# Patient Record
Sex: Male | Born: 1970 | Race: Black or African American | Hispanic: No | Marital: Single | Smoking: Former smoker
Health system: Southern US, Community
[De-identification: ages and names within clinical notes are randomized; demographics above are authoritative.]

## PROBLEM LIST (undated history)

## (undated) DIAGNOSIS — M549 Dorsalgia, unspecified: Secondary | ICD-10-CM

## (undated) DIAGNOSIS — W3301XA Accidental discharge of shotgun, initial encounter: Secondary | ICD-10-CM

## (undated) HISTORY — PX: SPLENECTOMY, TOTAL: SHX788

## (undated) HISTORY — PX: OTHER SURGICAL HISTORY: SHX169

---

## 2012-03-10 ENCOUNTER — Emergency Department (HOSPITAL_COMMUNITY)
Admission: EM | Admit: 2012-03-10 | Discharge: 2012-03-10 | Disposition: A | Discharge status: Home / Self Care | Payer: Self-pay | Attending: Emergency Medicine | Admitting: Emergency Medicine

## 2012-03-10 ENCOUNTER — Encounter (HOSPITAL_COMMUNITY): Payer: Self-pay | Admitting: *Deleted

## 2012-03-10 DIAGNOSIS — H109 Unspecified conjunctivitis: Secondary | ICD-10-CM | POA: Insufficient documentation

## 2012-03-10 DIAGNOSIS — F17200 Nicotine dependence, unspecified, uncomplicated: Secondary | ICD-10-CM | POA: Insufficient documentation

## 2012-03-10 DIAGNOSIS — H5789 Other specified disorders of eye and adnexa: Secondary | ICD-10-CM | POA: Insufficient documentation

## 2012-03-10 MED ORDER — ERYTHROMYCIN 5 MG/GM OP OINT
TOPICAL_OINTMENT | Freq: Once | OPHTHALMIC | Status: AC
  Administered 2012-03-10: 02:00:00 via OPHTHALMIC
  Filled 2012-03-10: qty 1

## 2012-03-10 NOTE — ED Notes (Signed)
Rt eye red and itching for 3 days redness

## 2012-03-10 NOTE — ED Provider Notes (Signed)
Medical screening examination/treatment/procedure(s) were performed by non-physician practitioner and as supervising physician I was immediately available for consultation/collaboration.  Doug Sou, MD 03/10/12 [phone]

## 2012-03-10 NOTE — ED Provider Notes (Signed)
History     CSN: [id number]  Arrival date & time 03/10/12  0046   None     Chief Complaint  Patient presents with  . Eye Problem    (Consider location/radiation/quality/duration/timing/severity/associated sxs/prior treatment) HPI Comments: Patient states, for the past 3, days.  He said away if she watery eye.  His daughter recently was diagnosed with pinkeye.  He denies any URI symptoms, previous history of pinkeye to his knowledge  Patient is a 41 y.o. male presenting with eye problem. The history is provided by the patient.  Eye Problem  This is a new problem. The current episode started more than 2 days ago. The problem occurs constantly. The problem has not changed since onset.The right eye is affected.Associated symptoms include discharge and eye redness. Pertinent negatives include no photophobia.    History reviewed. No pertinent past medical history.  History reviewed. No pertinent past surgical history.  No family history on file.  History  Substance Use Topics  . Smoking status: Current Every Day Smoker  . Smokeless tobacco: Not on file  . Alcohol Use:       Review of Systems  Constitutional: Negative for fever and chills.  HENT: Negative for congestion.   Eyes: Positive for discharge, redness and itching. Negative for photophobia, pain and visual disturbance.  Cardiovascular: Negative.   Gastrointestinal: Negative.   Genitourinary: Negative.   Musculoskeletal: Negative.   Neurological: Negative for dizziness and headaches.    Allergies  Review of patient's allergies indicates no known allergies.  Home Medications  No current outpatient prescriptions on file.  BP 178/130  Pulse 76  Temp 98.3 F (36.8 C) (Oral)  Resp 16  SpO2 97%  Physical Exam  Vitals reviewed. Constitutional: He appears well-developed.  HENT:  Head: Normocephalic.  Eyes: EOM are normal. Pupils are equal, round, and reactive to light. Right eye exhibits discharge and  exudate. Right conjunctiva is injected. Right conjunctiva has no hemorrhage. No scleral icterus.  Neck: Normal range of motion.  Cardiovascular: Normal rate.   Musculoskeletal: Normal range of motion.  Neurological: He is alert.  Skin: Skin is warm.    ED Course  Procedures (including critical care time)  Labs Reviewed - No data to display No results found.   No diagnosis found.    MDM   Patient has, conjunctivitis.  We'll treat with erythromycin ointment for the next 4-5 days Blood pressure was repeated with the appropriate sized cuff.  He is normotensive      Arman Filter, NP 03/10/12 [phone]

## 2012-12-06 ENCOUNTER — Encounter (HOSPITAL_COMMUNITY): Payer: Self-pay

## 2012-12-06 ENCOUNTER — Emergency Department (HOSPITAL_COMMUNITY)
Admission: EM | Admit: 2012-12-06 | Discharge: 2012-12-06 | Disposition: A | Discharge status: Home / Self Care | Payer: Self-pay | Attending: Emergency Medicine | Admitting: Emergency Medicine

## 2012-12-06 DIAGNOSIS — M545 Low back pain, unspecified: Secondary | ICD-10-CM | POA: Insufficient documentation

## 2012-12-06 DIAGNOSIS — M549 Dorsalgia, unspecified: Secondary | ICD-10-CM

## 2012-12-06 DIAGNOSIS — Z87828 Personal history of other (healed) physical injury and trauma: Secondary | ICD-10-CM | POA: Insufficient documentation

## 2012-12-06 DIAGNOSIS — F17200 Nicotine dependence, unspecified, uncomplicated: Secondary | ICD-10-CM | POA: Insufficient documentation

## 2012-12-06 DIAGNOSIS — M5430 Sciatica, unspecified side: Secondary | ICD-10-CM | POA: Insufficient documentation

## 2012-12-06 DIAGNOSIS — M5432 Sciatica, left side: Secondary | ICD-10-CM

## 2012-12-06 HISTORY — DX: Dorsalgia, unspecified: M54.9

## 2012-12-06 HISTORY — DX: Accidental discharge of shotgun, initial encounter: W33.01XA

## 2012-12-06 MED ORDER — KETOROLAC TROMETHAMINE 60 MG/2ML IM SOLN
60.0000 mg | Freq: Once | INTRAMUSCULAR | Status: AC
  Administered 2012-12-06: 60 mg via INTRAMUSCULAR
  Filled 2012-12-06: qty 2

## 2012-12-06 MED ORDER — PREDNISONE 20 MG PO TABS: 40.0000 mg | ORAL_TABLET | Freq: Every day | ORAL | Status: DC

## 2012-12-06 MED ORDER — HYDROCODONE-ACETAMINOPHEN 5-325 MG PO TABS: 2.0000 | ORAL_TABLET | Freq: Four times a day (QID) | ORAL | Status: DC | PRN

## 2012-12-06 MED ORDER — IBUPROFEN 800 MG PO TABS: 800.0000 mg | ORAL_TABLET | Freq: Three times a day (TID) | ORAL | Status: DC

## 2012-12-06 MED ORDER — HYDROCODONE-ACETAMINOPHEN 5-325 MG PO TABS
2.0000 | ORAL_TABLET | Freq: Once | ORAL | Status: AC
  Administered 2012-12-06: 2 via ORAL
  Filled 2012-12-06: qty 2

## 2012-12-06 NOTE — ED Notes (Signed)
Pt arrives to the ED ambulatory with lower back pain that started on Friday and the pain radiates down the left leg with some tingling in the leg.  Pt does not remember doing anything to injure his back

## 2012-12-06 NOTE — ED Provider Notes (Signed)
Medical screening examination/treatment/procedure(s) were performed by non-physician practitioner and as supervising physician I was immediately available for consultation/collaboration.  Reshanda Lewey M Sheilyn Boehlke, MD 12/06/12 0644 
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
## 2013-02-02 ENCOUNTER — Emergency Department (INDEPENDENT_AMBULATORY_CARE_PROVIDER_SITE_OTHER): Payer: Self-pay

## 2013-02-02 ENCOUNTER — Emergency Department (INDEPENDENT_AMBULATORY_CARE_PROVIDER_SITE_OTHER)
Admission: EM | Admit: 2013-02-02 | Discharge: 2013-02-02 | Disposition: A | Discharge status: Home / Self Care | Payer: Self-pay | Source: Home / Self Care | Attending: Emergency Medicine | Admitting: Emergency Medicine

## 2013-02-02 ENCOUNTER — Encounter (HOSPITAL_COMMUNITY): Payer: Self-pay | Admitting: Emergency Medicine

## 2013-02-02 DIAGNOSIS — M25569 Pain in unspecified knee: Secondary | ICD-10-CM

## 2013-02-02 DIAGNOSIS — M25562 Pain in left knee: Secondary | ICD-10-CM

## 2013-02-02 MED ORDER — KETOROLAC TROMETHAMINE 60 MG/2ML IM SOLN
INTRAMUSCULAR | Status: AC
  Filled 2013-02-02: qty 2

## 2013-02-02 MED ORDER — MELOXICAM 15 MG PO TABS: 15.0000 mg | ORAL_TABLET | Freq: Every day | ORAL | Status: DC

## 2013-02-02 MED ORDER — KETOROLAC TROMETHAMINE 60 MG/2ML IM SOLN
60.0000 mg | Freq: Once | INTRAMUSCULAR | Status: AC
  Administered 2013-02-02: 60 mg via INTRAMUSCULAR

## 2013-02-02 MED ORDER — PREDNISONE 10 MG PO TABS: ORAL_TABLET | ORAL | Status: DC

## 2013-02-02 NOTE — ED Notes (Signed)
C/o pain and swelling in left knee for past few days, no known injury; no relief w OTC medications and knee brace since yesterday

## 2013-02-02 NOTE — ED Provider Notes (Signed)
Chief Complaint:   Chief Complaint  Patient presents with  . Knee Pain    History of Present Illness:   Antonio Malone is a 42 year old male who has had a three-day history of left knee pain and swelling. He denies any injury. The knee is swollen somewhat warm. He localizes the pain over the anterior knee. There is no locking, popping, or giving way of the knee joint. It hurts to walk or to bend the knee.  Review of Systems:  Other than noted above, the patient denies any of the following symptoms: Systemic:  No fevers, chills, sweats, or aches.  No fatigue or tiredness. Musculoskeletal:  No joint pain, arthritis, bursitis, swelling, back pain, or neck pain.  Neurological:  No muscular weakness, paresthesias, headache, or trouble with speech or coordination.  No dizziness.  PMFSH:  Past medical history, family history, social history, meds, and allergies were reviewed.  No prior history of knee pain or arthritis.  He does not have a prior history of gout.  Physical Exam:   Vital signs:  BP 162/98  Pulse 83  Temp(Src) 98.2 F (36.8 C) (Oral)  Resp 16  SpO2 97% Gen:  Alert and oriented times 3.  In no distress. Musculoskeletal: There is mild visible swelling, some joint effusion, and mild warmth, but no erythema. There is tenderness to palpation around the patella, over the patella, at the medial and lateral joint lines. He can flex to 135, but this causes pain.   McMurray's test was negative.  Lachman's test was negative.  Anterior drawer test was negative.   Varus and valgus stress negative.  Otherwise, all joints had a full a ROM with no swelling, bruising or deformity.  No edema, pulses full. Extremities were warm and pink.  Capillary refill was brisk.  Skin:  Clear, warm and dry.  No rash. Neuro:  Alert and oriented times 3.  Muscle strength was normal.  Sensation was intact to light touch.   Radiology:  Dg Knee Complete 4 Views Left  02/02/2013   CLINICAL DATA:  Pain and swelling left  knee for 2 days without injury  EXAM: LEFT KNEE - COMPLETE 4+ VIEW  COMPARISON:  None  FINDINGS: Question mild patellofemoral joint space narrowing.  Osseous mineralization normal.  Remaining joint spaces preserved.  No acute fracture, dislocation or bone destruction.  Small knee joint effusion present.  Remaining soft tissues unremarkable.  IMPRESSION: Mild patellofemoral degenerative changes and knee joint effusion.  No acute osseous findings.   Electronically Signed   By: Ulyses Southward M.D.   On: 02/02/2013 11:56   I reviewed the images independently and personally and concur with the radiologist's findings.   Course in Urgent Care Center:   He was given Toradol 60 mg IM with almost immediate improvement in his pain. He was placed in a knee immobilizer.  Assessment:  The encounter diagnosis was Knee pain, acute, left.  Differential diagnosis is gout versus internal cartilage derangement in the knee such as a torn meniscus or chondromalacia patella.  Plan:   1.  Meds:  The following meds were prescribed:   Discharge Medication List as of 02/02/2013 12:27 PM    START taking these medications   Details  meloxicam (MOBIC) 15 MG tablet Take 1 tablet (15 mg total) by mouth daily., Starting 02/02/2013, Until Discontinued, Normal    !! predniSONE (DELTASONE) 10 MG tablet Take 4 tabs daily for 4 days, 3 tabs daily for 4 days, 2 tabs daily for 4 days,  then 1 tab daily for 4 days., Normal     !! - Potential duplicate medications found. Please discuss with provider.      2.  Patient Education/Counseling:  The patient was given appropriate handouts, self care instructions, and instructed in symptomatic relief, including rest and activity, elevation, application of ice and compression.   3.  Follow up:  The patient was told to follow up if no better in 3 to 4 days, if becoming worse in any way, and given some red flag symptoms such as fever or worsening pain which would prompt immediate return.  Follow  up in 2 weeks if no better with Dr. August Saucer.     Reuben Likes, MD 02/02/13 [phone]

## 2015-02-02 IMAGING — CR DG KNEE COMPLETE 4+V*L*
4 series · 4 of 4 positions shown · non-contrast
Comparison: None

CLINICAL DATA: Pain and swelling left knee for 2 days without
injury

EXAM:
LEFT KNEE - COMPLETE 4+ VIEW

[view not recorded (1 of 4)]
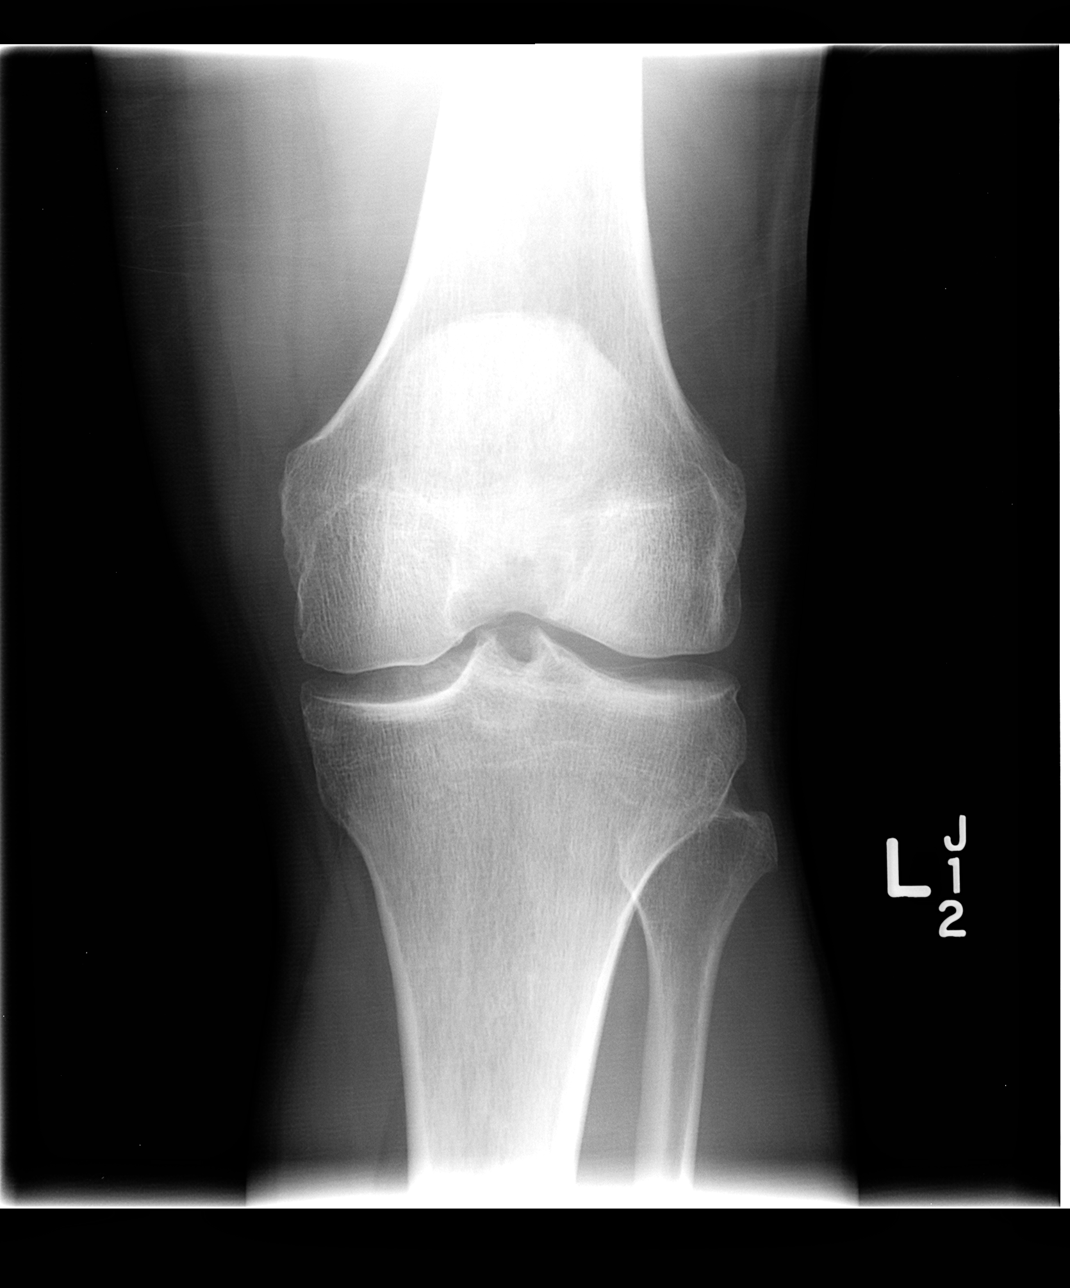

[view not recorded (2 of 4)]
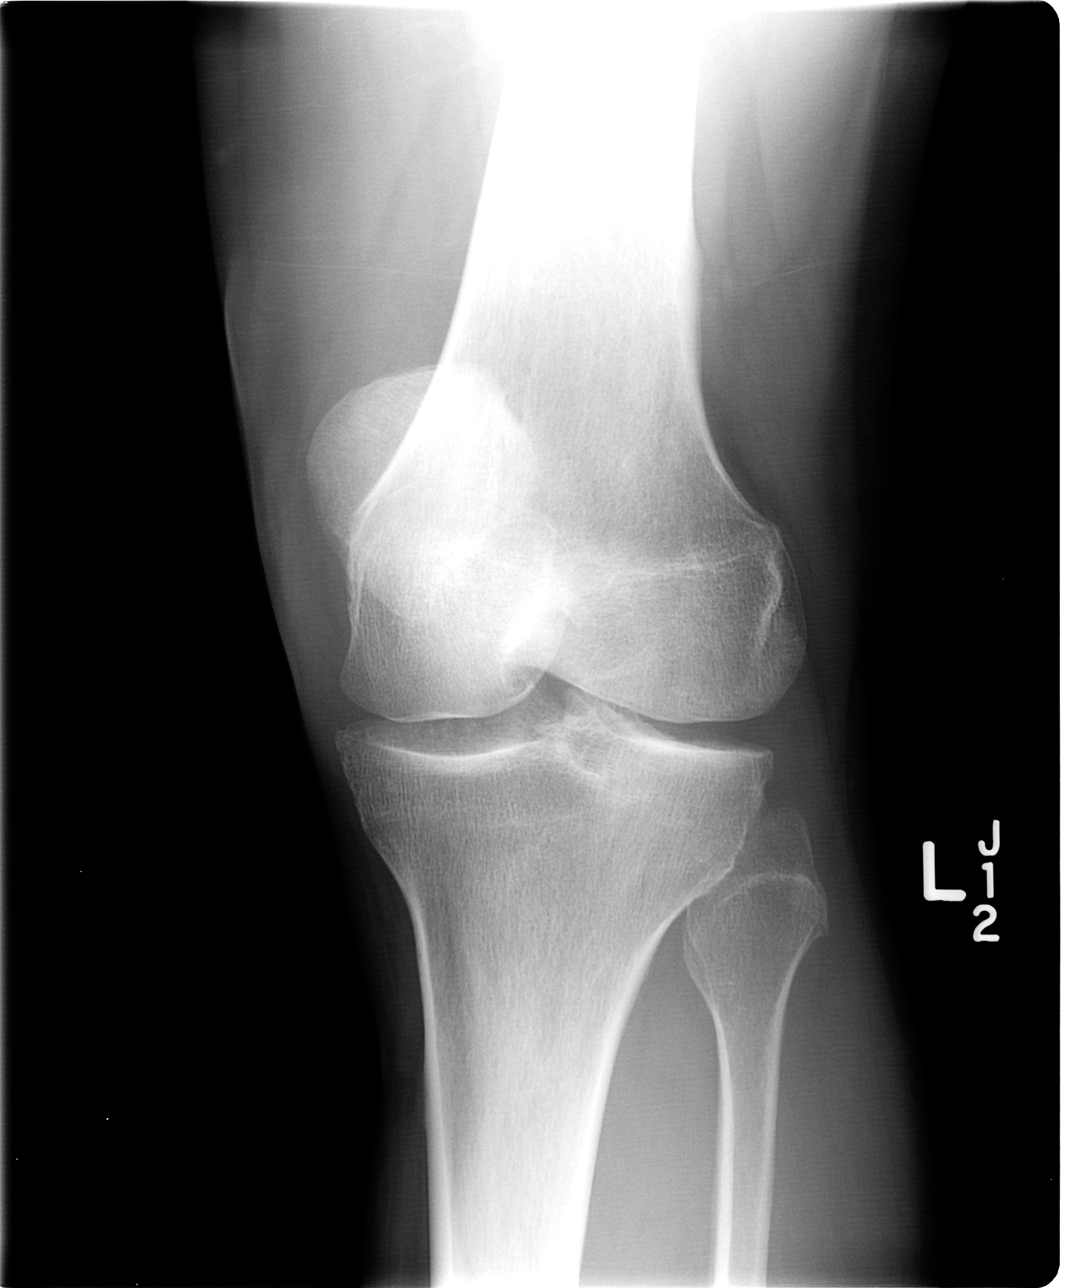

[view not recorded (3 of 4)]
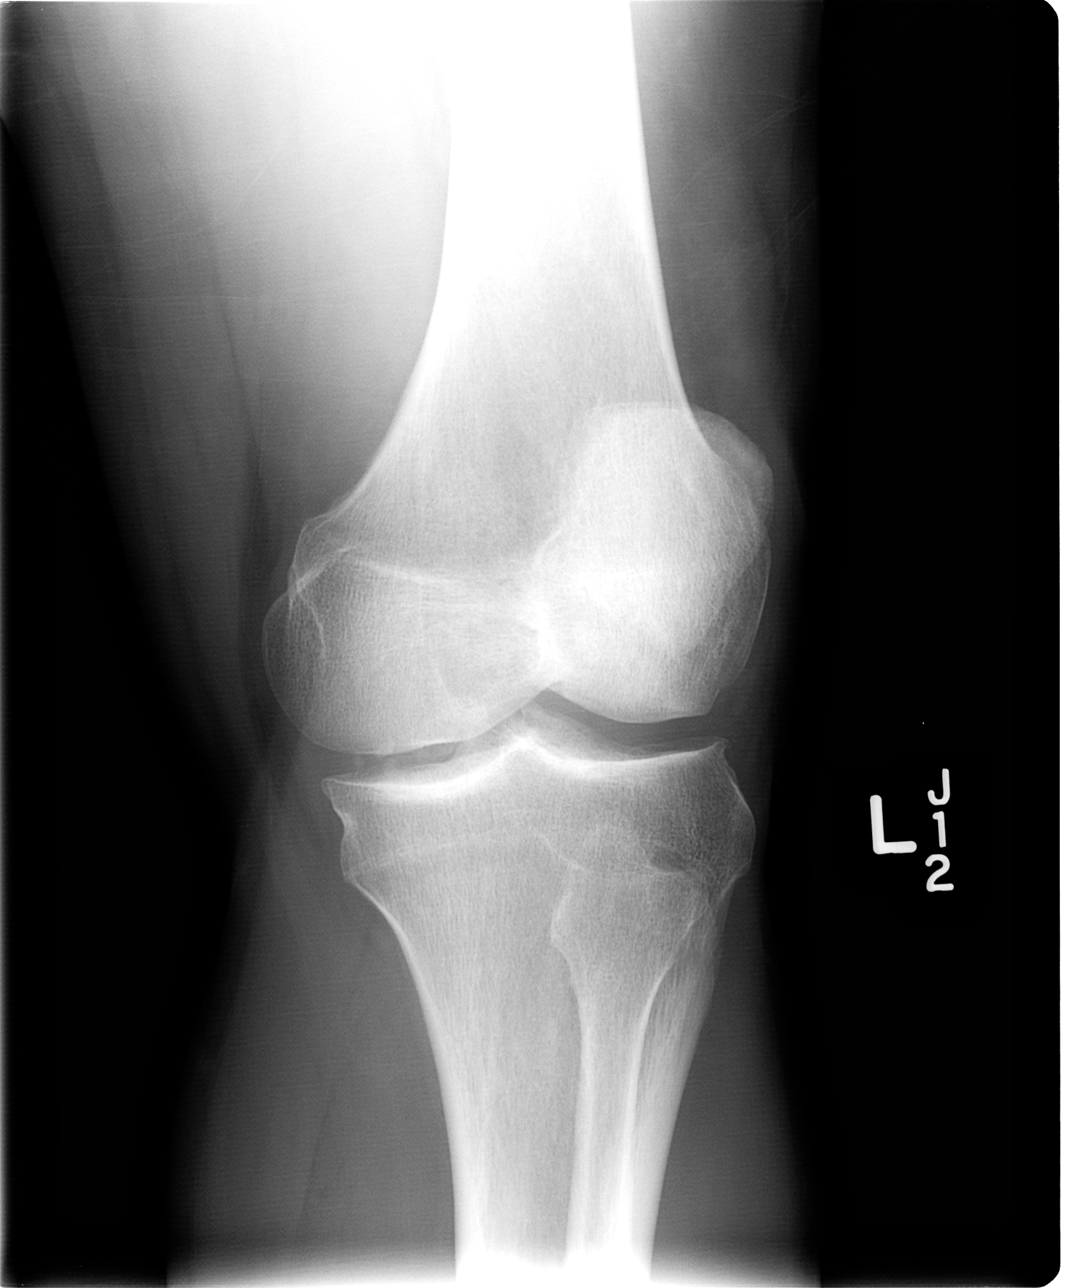

[view not recorded (4 of 4)]
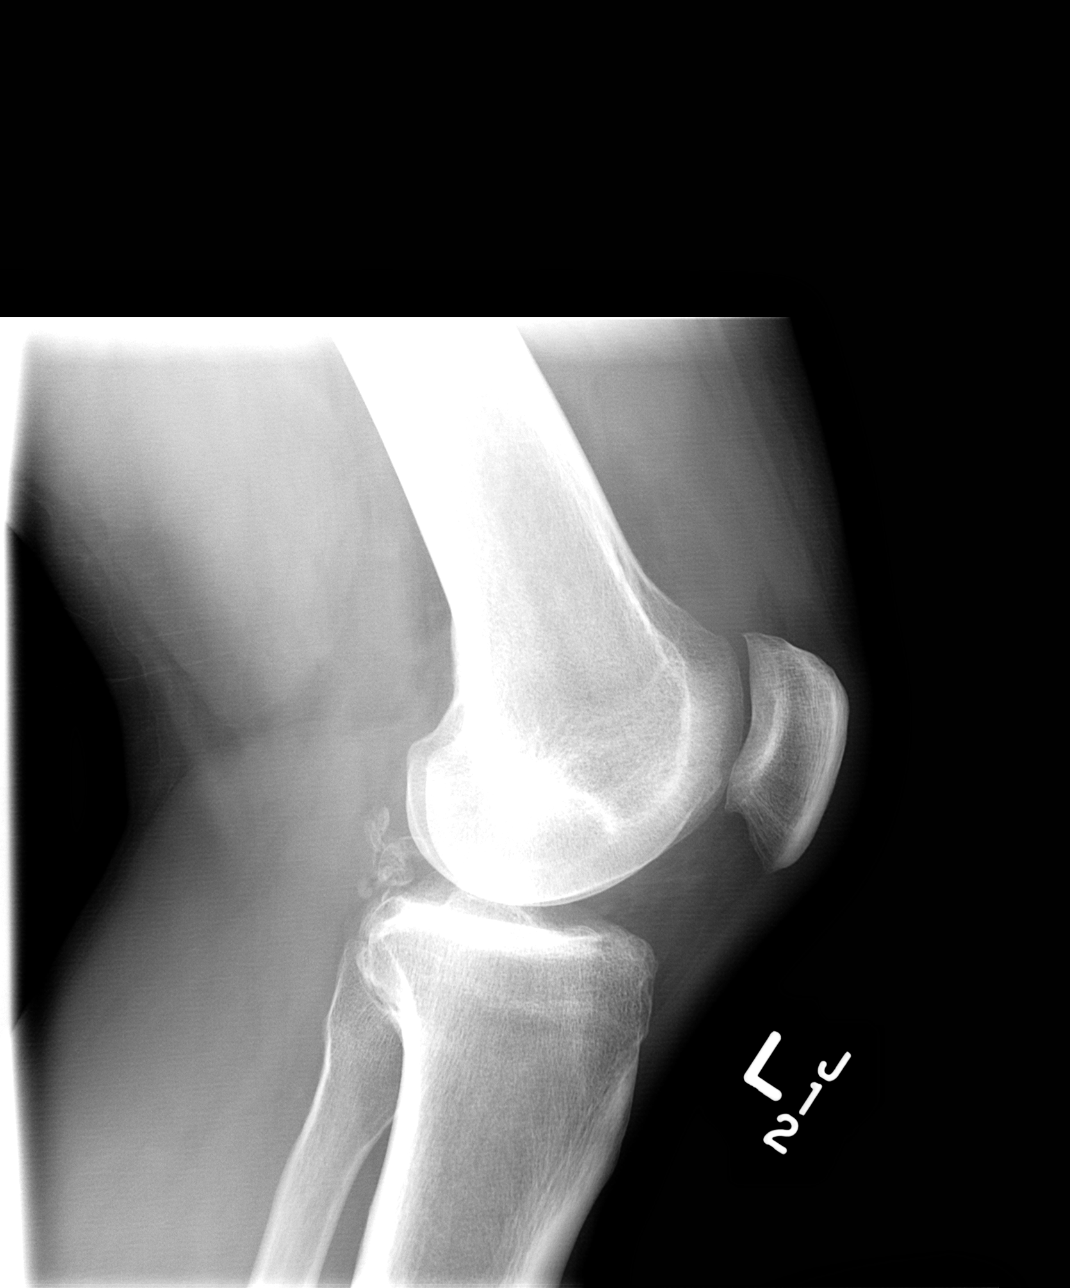

[4 of 4 positions shown; findings below may reference images not displayed]

FINDINGS: Question mild patellofemoral joint space narrowing.

Osseous mineralization normal.

Remaining joint spaces preserved.

No acute fracture, dislocation or bone destruction.

Small knee joint effusion present.

Remaining soft tissues unremarkable.
IMPRESSION: Mild patellofemoral degenerative changes and knee joint effusion.

No acute osseous findings.

## 2015-06-13 ENCOUNTER — Encounter (HOSPITAL_COMMUNITY): Payer: Self-pay | Admitting: *Deleted

## 2015-06-13 ENCOUNTER — Emergency Department (HOSPITAL_COMMUNITY): Payer: Self-pay

## 2015-06-13 ENCOUNTER — Emergency Department (HOSPITAL_COMMUNITY)
Admission: EM | Admit: 2015-06-13 | Discharge: 2015-06-13 | Disposition: A | Discharge status: Home / Self Care | Payer: Self-pay | Attending: Emergency Medicine | Admitting: Emergency Medicine

## 2015-06-13 DIAGNOSIS — M19071 Primary osteoarthritis, right ankle and foot: Secondary | ICD-10-CM | POA: Insufficient documentation

## 2015-06-13 DIAGNOSIS — F17200 Nicotine dependence, unspecified, uncomplicated: Secondary | ICD-10-CM | POA: Insufficient documentation

## 2015-06-13 DIAGNOSIS — Z791 Long term (current) use of non-steroidal anti-inflammatories (NSAID): Secondary | ICD-10-CM | POA: Insufficient documentation

## 2015-06-13 LAB — I-STAT CHEM 8, ED
BUN: 15 mg/dL (ref 6–20)
CALCIUM ION: 1.2 mmol/L (ref 1.12–1.23)
CREATININE: 0.7 mg/dL (ref 0.61–1.24)
Chloride: 103 mmol/L (ref 101–111)
GLUCOSE: 106 mg/dL — AB (ref 65–99)
HCT: 49 % (ref 39.0–52.0)
Hemoglobin: 16.7 g/dL (ref 13.0–17.0)
Potassium: 4.1 mmol/L (ref 3.5–5.1)
Sodium: 140 mmol/L (ref 135–145)
TCO2: 25 mmol/L (ref 0–100)

## 2015-06-13 MED ORDER — IBUPROFEN 600 MG PO TABS: 600.0000 mg | ORAL_TABLET | Freq: Four times a day (QID) | ORAL | Status: DC | PRN

## 2015-06-13 MED ORDER — KETOROLAC TROMETHAMINE 30 MG/ML IJ SOLN
15.0000 mg | Freq: Once | INTRAMUSCULAR | Status: AC
  Administered 2015-06-13: 15 mg via INTRAMUSCULAR
  Filled 2015-06-13: qty 1

## 2015-06-13 NOTE — ED Notes (Signed)
Pt reports pain and swelling to posterior right foot/ankle. Denies injury.

## 2015-06-13 NOTE — ED Notes (Signed)
Patient transported to X-ray 

## 2015-06-13 NOTE — Discharge Instructions (Signed)

## 2015-06-13 NOTE — ED Notes (Signed)
Pt is in stable condition upon d/c and ambulates from ED. 
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
## 2017-06-12 IMAGING — CR DG ANKLE COMPLETE 3+V*R*
3 series · 3 of 3 positions shown · non-contrast
Comparison: None.

CLINICAL DATA: 44-year-old male with pain along the posterior
ankle, Achilles pain, after jumping last week. Initial encounter.

EXAM:
RIGHT ANKLE - COMPLETE 3+ VIEW

[ankle ap]
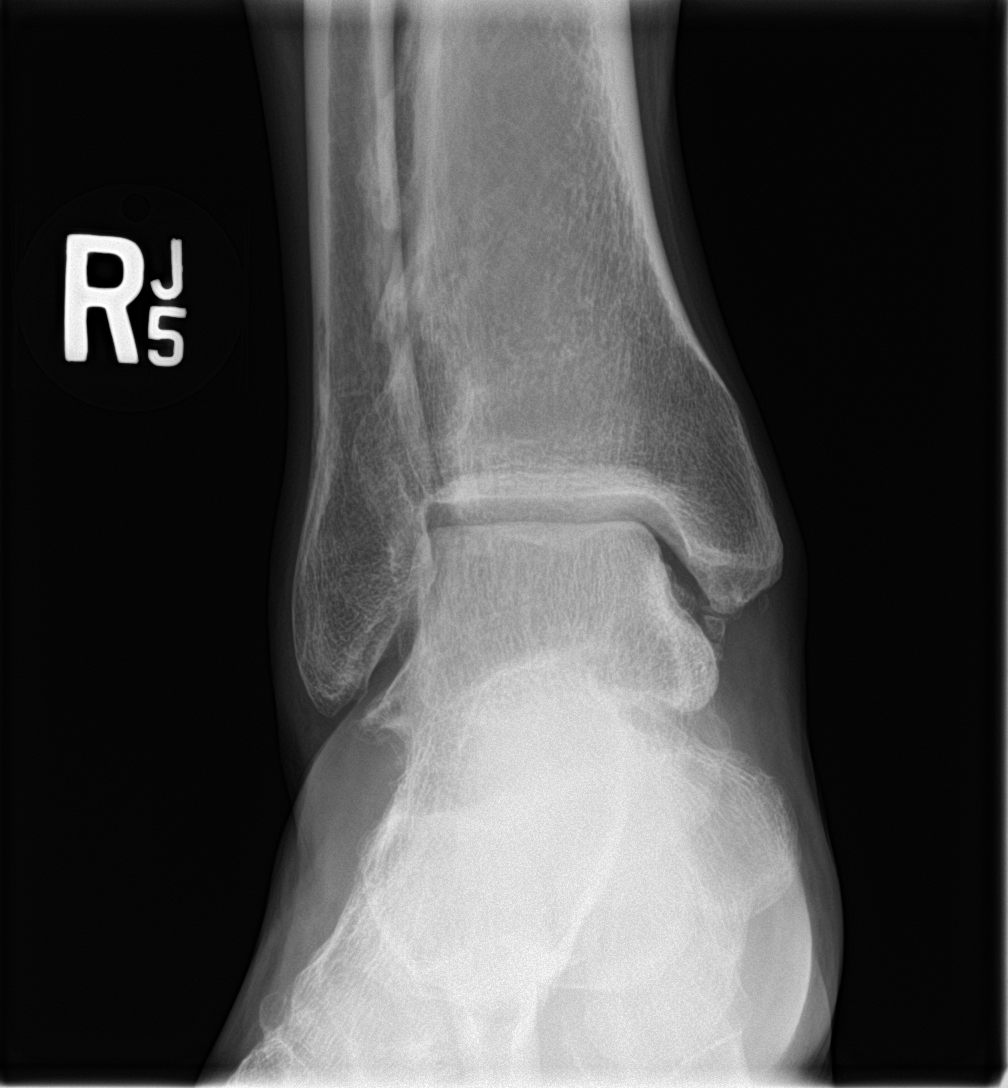

[ankle obl]
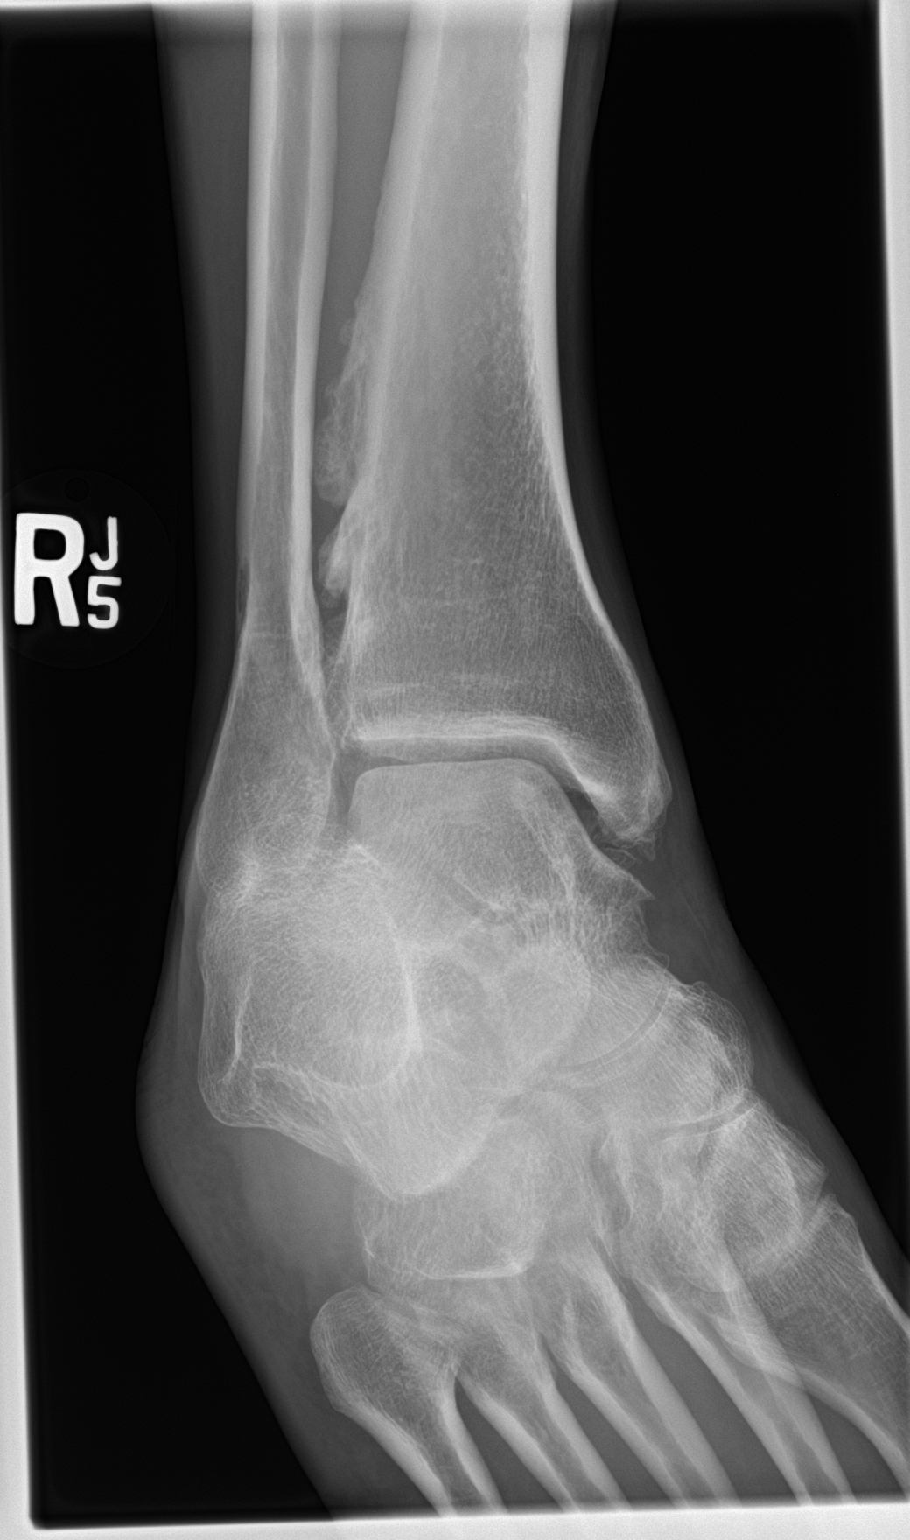

[ankle lat]
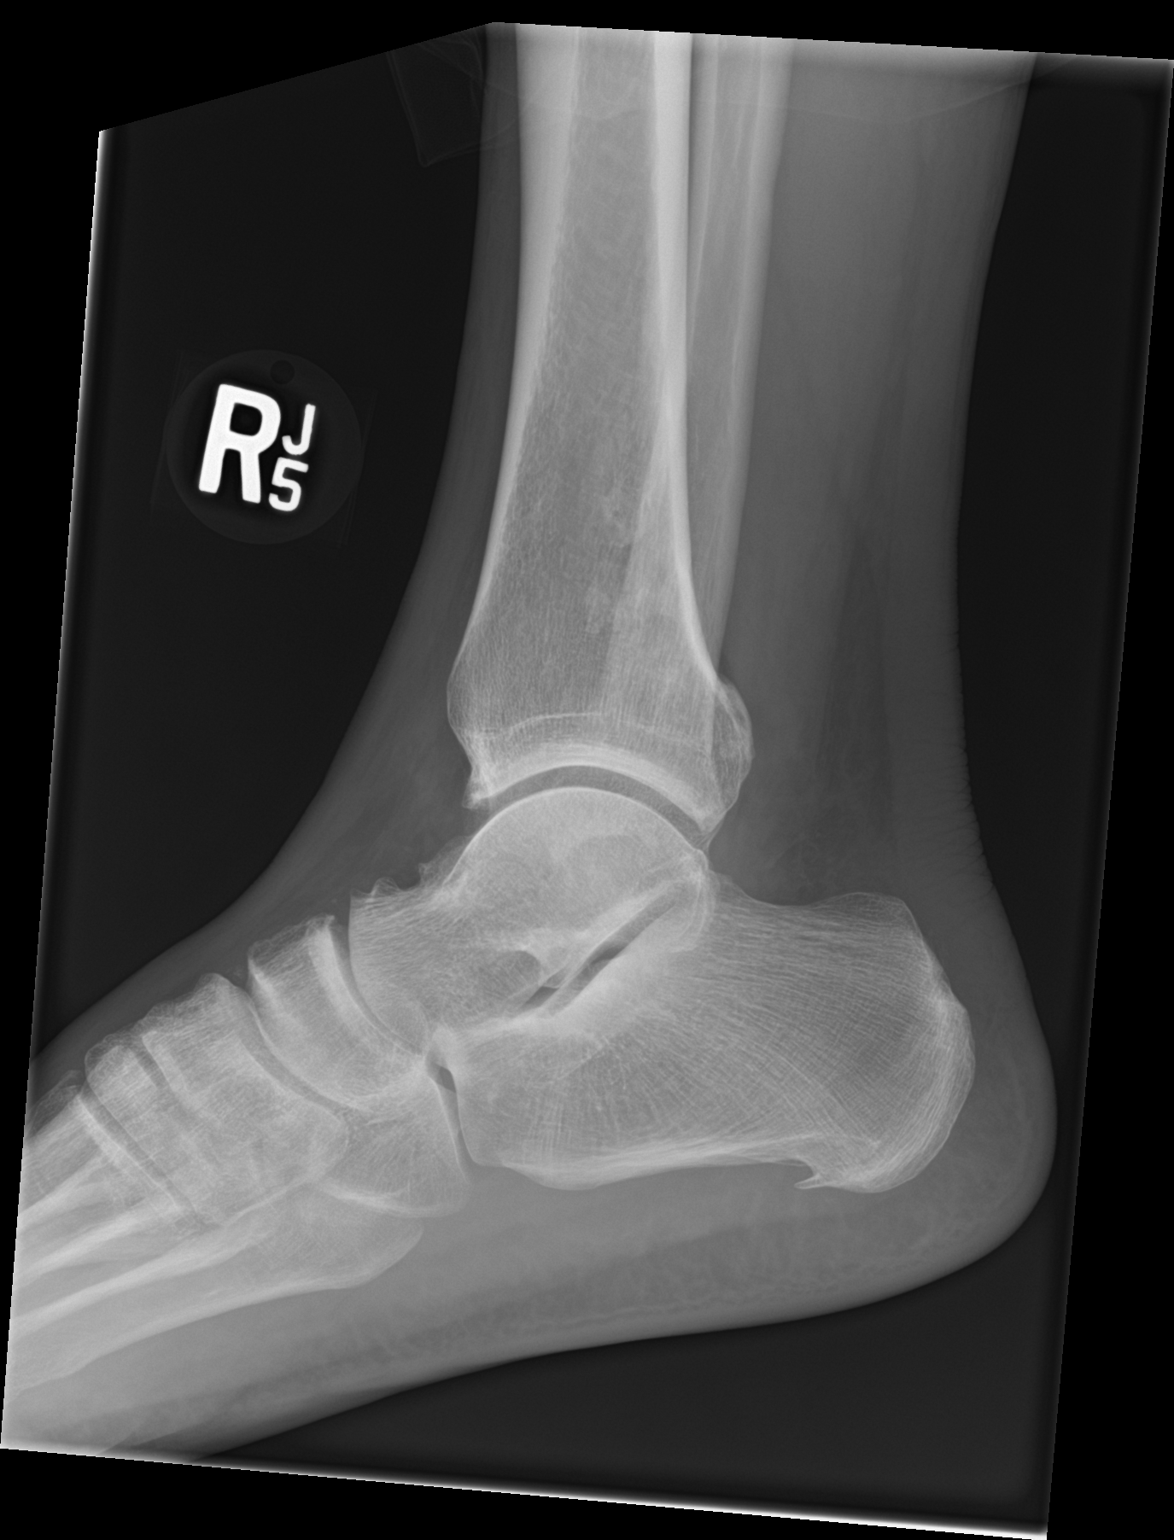

[3 of 3 positions shown; findings below may reference images not displayed]

FINDINGS: Mortise joint alignment preserved. Talar dome intact. No joint
effusion evident. The calcaneus appears intact with degenerative
spurring. Moderate and age advanced degenerative spurring about the
mortise joint. Medial, lateral malleolus and talus are affected.
There is dystrophic calcification along the tibia fibular
syndesmosis. Healed chronic fracture of the posterior malleolus
superimposed. No acute osseous abnormality identified.
IMPRESSION: Age advanced degenerative changes about the right ankle. Chronic
posterior malleolus fracture. No acute osseous abnormality
identified.

## 2022-01-09 ENCOUNTER — Encounter (HOSPITAL_BASED_OUTPATIENT_CLINIC_OR_DEPARTMENT_OTHER): Payer: Self-pay

## 2022-01-09 ENCOUNTER — Emergency Department (HOSPITAL_BASED_OUTPATIENT_CLINIC_OR_DEPARTMENT_OTHER): Payer: Self-pay | Admitting: Radiology

## 2022-01-09 ENCOUNTER — Emergency Department (HOSPITAL_BASED_OUTPATIENT_CLINIC_OR_DEPARTMENT_OTHER)
Admission: EM | Admit: 2022-01-09 | Discharge: 2022-01-09 | Disposition: A | Discharge status: Home / Self Care | Payer: Self-pay | Attending: Emergency Medicine | Admitting: Emergency Medicine

## 2022-01-09 ENCOUNTER — Other Ambulatory Visit: Payer: Self-pay

## 2022-01-09 DIAGNOSIS — M25562 Pain in left knee: Secondary | ICD-10-CM | POA: Insufficient documentation

## 2022-01-09 DIAGNOSIS — Z87891 Personal history of nicotine dependence: Secondary | ICD-10-CM | POA: Insufficient documentation

## 2022-01-09 DIAGNOSIS — M1120 Other chondrocalcinosis, unspecified site: Secondary | ICD-10-CM | POA: Insufficient documentation

## 2022-01-09 MED ORDER — NAPROXEN 500 MG PO TABS: 500.0000 mg | ORAL_TABLET | Freq: Two times a day (BID) | ORAL | 0 refills | Status: DC

## 2022-01-09 NOTE — Discharge Instructions (Signed)
You were evaluated in the Emergency Department and after careful evaluation, we did not find any emergent condition requiring admission or further testing in the hospital.  Your exam/testing today is overall reassuring.  X-ray did not show any broken bones, does show that you have some arthritis in the knee.  Possibly a specific form of arthritis called chondrocalcinosis.  Recommend using the Naprosyn twice daily for pain, resting the knee.  Recommend follow-up with an orthopedic specialist if still having pain after 2 weeks.  Please return to the Emergency Department if you experience any worsening of your condition.   Thank you for allowing [place] to be a part of your care.
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
## 2022-01-09 NOTE — ED Triage Notes (Signed)
Patient here POV from Home.  Endorses Left Knee Pain for approximately 1 Week. No Known Injury but endorses Heavy Use during Work.  NAD Noted during Triage. A&Ox4. GCS 15. Ambulatory.

## 2022-04-01 ENCOUNTER — Ambulatory Visit
Admission: EM | Admit: 2022-04-01 | Discharge: 2022-04-01 | Disposition: A | Discharge status: Home / Self Care | Payer: Self-pay | Attending: Emergency Medicine | Admitting: Emergency Medicine

## 2022-04-01 DIAGNOSIS — J0140 Acute pansinusitis, unspecified: Secondary | ICD-10-CM

## 2022-04-01 DIAGNOSIS — J189 Pneumonia, unspecified organism: Secondary | ICD-10-CM

## 2022-04-01 MED ORDER — ALBUTEROL SULFATE (2.5 MG/3ML) 0.083% IN NEBU
2.5000 mg | INHALATION_SOLUTION | Freq: Once | RESPIRATORY_TRACT | Status: AC
  Administered 2022-04-01: 2.5 mg via RESPIRATORY_TRACT

## 2022-04-01 MED ORDER — FLUTICASONE PROPIONATE 50 MCG/ACT NA SUSP: 2.0000 | Freq: Every day | NASAL | 0 refills | Status: AC

## 2022-04-01 MED ORDER — ALBUTEROL SULFATE HFA 108 (90 BASE) MCG/ACT IN AERS: 1.0000 | INHALATION_SPRAY | RESPIRATORY_TRACT | 0 refills | Status: AC | PRN

## 2022-04-01 MED ORDER — AMOXICILLIN-POT CLAVULANATE 875-125 MG PO TABS: 1.0000 | ORAL_TABLET | Freq: Two times a day (BID) | ORAL | 0 refills | Status: AC

## 2022-04-01 MED ORDER — ACETAMINOPHEN 325 MG PO TABS
975.0000 mg | ORAL_TABLET | Freq: Once | ORAL | Status: AC
  Administered 2022-04-01: 975 mg via ORAL

## 2022-04-01 MED ORDER — IPRATROPIUM-ALBUTEROL 0.5-2.5 (3) MG/3ML IN SOLN
3.0000 mL | Freq: Once | RESPIRATORY_TRACT | Status: AC
  Administered 2022-04-01: 3 mL via RESPIRATORY_TRACT

## 2022-04-01 MED ORDER — AEROCHAMBER MV MISC: 1 refills | Status: AC

## 2022-04-01 MED ORDER — IBUPROFEN 600 MG PO TABS: 600.0000 mg | ORAL_TABLET | Freq: Four times a day (QID) | ORAL | 0 refills | Status: AC | PRN

## 2022-04-01 MED ORDER — IBUPROFEN 800 MG PO TABS
800.0000 mg | ORAL_TABLET | Freq: Once | ORAL | Status: AC
  Administered 2022-04-01: 800 mg via ORAL
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
## 2022-04-01 NOTE — Discharge Instructions (Signed)
Finish the Augmentin, even if you feel better.  2 puffs from your albuterol inhaler every 4 hours for 2 days, then every 6 hours for 2 days, then as needed.  You can back off on the albuterol if you start to improve sooner.  Take 600 mg of ibuprofen with 1000 mg of Tylenol 3 times a day as needed for body ache, headache, fevers.  Saline nasal irrigation with a NeilMed sinus rinse and distilled water as often as you want.  Mucinex or Mucinex DM if the cough becomes particularly bothersome.

## 2022-04-01 NOTE — ED Triage Notes (Signed)
Pt presents with productive cough with colored mucous, chills, headache, generalized body aches, and weakness X 5 days.

## 2022-11-19 ENCOUNTER — Emergency Department (HOSPITAL_BASED_OUTPATIENT_CLINIC_OR_DEPARTMENT_OTHER)
Admission: EM | Admit: 2022-11-19 | Discharge: 2022-11-19 | Disposition: A | Discharge status: Home / Self Care | Payer: 59 | Attending: Emergency Medicine | Admitting: Emergency Medicine

## 2022-11-19 ENCOUNTER — Other Ambulatory Visit: Payer: Self-pay

## 2022-11-19 ENCOUNTER — Encounter (HOSPITAL_BASED_OUTPATIENT_CLINIC_OR_DEPARTMENT_OTHER): Payer: Self-pay | Admitting: Emergency Medicine

## 2022-11-19 DIAGNOSIS — R0981 Nasal congestion: Secondary | ICD-10-CM | POA: Diagnosis present

## 2022-11-19 DIAGNOSIS — U071 COVID-19: Secondary | ICD-10-CM | POA: Diagnosis not present

## 2022-11-19 LAB — SARS CORONAVIRUS 2 BY RT PCR: SARS Coronavirus 2 by RT PCR: POSITIVE — AB

## 2022-11-19 NOTE — ED Notes (Signed)
Reviewed AVS with patient, patient expressed understanding of directions, denies further questions at this time. 

## 2022-11-19 NOTE — ED Triage Notes (Signed)
Nasal congestion, HA x 3 days. No known sick contacts.

## 2022-11-19 NOTE — ED Provider Notes (Signed)
[place] [facility] AT [facility] Provider Note   CSN: [id number] Arrival date & time: 11/19/22  0145     History  Chief Complaint  Patient presents with   Nasal Congestion    Antonio Malone is a 52 y.o. male.  The history is provided by the patient.  He has had nasal congestion for about the last 4 days.  He has had yellowish rhinorrhea and noticed that he cannot smell.  He denies fever but has had some chills and sweats.  There has been a cough productive of light yellow sputum.  He denies nausea, vomiting, diarrhea.  There have been no known sick contacts.   Home Medications Prior to Admission medications   Medication Sig Start Date End Date Taking? Authorizing Provider  albuterol (VENTOLIN HFA) 108 (90 Base) MCG/ACT inhaler Inhale 1-2 puffs into the lungs every 4 (four) hours as needed for wheezing or shortness of breath. 04/01/22   Domenick Gong, MD  amoxicillin-clavulanate (AUGMENTIN) 875-125 MG tablet Take 1 tablet by mouth every 12 (twelve) hours. 04/01/22   Domenick Gong, MD  fluticasone (FLONASE) 50 MCG/ACT nasal spray Place 2 sprays into both nostrils daily. 04/01/22   Domenick Gong, MD  ibuprofen (ADVIL) 600 MG tablet Take 1 tablet (600 mg total) by mouth every 6 (six) hours as needed. 04/01/22   Domenick Gong, MD  Spacer/Aero-Holding Chambers (AEROCHAMBER MV) inhaler Use as instructed 04/01/22   Domenick Gong, MD      Allergies    Patient has no known allergies.    Review of Systems   Review of Systems  All other systems reviewed and are negative.   Physical Exam Updated Vital Signs BP (!) 149/81 (BP Location: Right Arm)   Pulse 75   Temp 98.8 F (37.1 C) (Oral)   Resp 18   Wt 96.2 kg   SpO2 97%   BMI 33.20 kg/m  Physical Exam Vitals and nursing note reviewed.   52 year old male, resting comfortably and in no acute distress. Vital signs are significant for elevated blood pressure. Oxygen saturation is 97%, which is  normal. Head is normocephalic and atraumatic. PERRLA, EOMI. Oropharynx is clear. Neck is nontender and supple without adenopathy or JVD. Back is nontender and there is no CVA tenderness. Lungs are clear without rales, wheezes, or rhonchi. Chest is nontender. Heart has regular rate and rhythm without murmur. Abdomen is soft, flat, nontender Skin is warm and dry without rash. Neurologic: Mental status is normal, cranial nerves are intact, moves all extremities equally.  ED Results / Procedures / Treatments   Labs (all labs ordered are listed, but only abnormal results are displayed) Labs Reviewed  SARS CORONAVIRUS 2 BY RT PCR - Abnormal; Notable for the following components:      Result Value   SARS Coronavirus 2 by RT PCR POSITIVE (*)    All other components within normal limits    EKG None  Radiology No results found.  Procedures Procedures    Medications Ordered in ED Medications - No data to display  ED Course/ Medical Decision Making/ A&P                                 Medical Decision Making  Upper respiratory infection.  I have reviewed his laboratory test and my interpretation is positive PCR for COVID-19.  This is causing his upper respiratory infection.  No indication for antibiotics.  He has  no risk factors for severe disease, Paxlovid not indicated.  I am discharging him with instructions to use over-the-counter cough and cold medications, acetaminophen and ibuprofen as needed for fever or aching.  Final Clinical Impression(s) / ED Diagnoses Final diagnoses:  COVID-19 virus infection    Rx / DC Orders ED Discharge Orders     None         Dione Booze, MD 11/19/22 [phone]
